# Patient Record
Sex: Male | Born: 1975 | Race: Black or African American | Hispanic: No | Marital: Married | Smoking: Former smoker
Health system: Southern US, Community
[De-identification: ages and names within clinical notes are randomized; demographics above are authoritative.]

## PROBLEM LIST (undated history)

## (undated) ENCOUNTER — Ambulatory Visit: Admission: EM | Source: Home / Self Care

## (undated) DIAGNOSIS — B181 Chronic viral hepatitis B without delta-agent: Secondary | ICD-10-CM

---

## 1999-04-21 ENCOUNTER — Emergency Department (HOSPITAL_COMMUNITY): Admission: EM | Admit: 1999-04-21 | Discharge: 1999-04-21 | Payer: Self-pay | Admitting: Emergency Medicine

## 1999-05-04 ENCOUNTER — Emergency Department (HOSPITAL_COMMUNITY): Admission: EM | Admit: 1999-05-04 | Discharge: 1999-05-04 | Payer: Self-pay | Admitting: Emergency Medicine

## 2000-03-28 ENCOUNTER — Emergency Department (HOSPITAL_COMMUNITY): Admission: EM | Admit: 2000-03-28 | Discharge: 2000-03-28 | Payer: Self-pay | Admitting: Emergency Medicine

## 2000-05-08 ENCOUNTER — Inpatient Hospital Stay (HOSPITAL_COMMUNITY): Admission: EM | Admit: 2000-05-08 | Discharge: 2000-05-10 | Payer: Self-pay | Admitting: Emergency Medicine

## 2000-05-08 ENCOUNTER — Encounter: Payer: Self-pay | Admitting: Emergency Medicine

## 2000-07-10 ENCOUNTER — Emergency Department (HOSPITAL_COMMUNITY): Admission: EM | Admit: 2000-07-10 | Discharge: 2000-07-11 | Payer: Self-pay | Admitting: *Deleted

## 2000-10-29 ENCOUNTER — Emergency Department (HOSPITAL_COMMUNITY): Admission: EM | Admit: 2000-10-29 | Discharge: 2000-10-29 | Payer: Self-pay | Admitting: Emergency Medicine

## 2002-08-25 ENCOUNTER — Emergency Department (HOSPITAL_COMMUNITY): Admission: EM | Admit: 2002-08-25 | Discharge: 2002-08-25 | Payer: Self-pay | Admitting: Emergency Medicine

## 2014-03-01 ENCOUNTER — Encounter (HOSPITAL_COMMUNITY): Payer: Self-pay | Admitting: Emergency Medicine

## 2014-03-01 ENCOUNTER — Emergency Department (HOSPITAL_COMMUNITY)
Admission: EM | Admit: 2014-03-01 | Discharge: 2014-03-01 | Disposition: A | Discharge status: Home / Self Care | Payer: 59 | Source: Home / Self Care | Attending: Family Medicine | Admitting: Family Medicine

## 2014-03-01 DIAGNOSIS — J00 Acute nasopharyngitis [common cold]: Secondary | ICD-10-CM

## 2014-03-01 MED ORDER — BENZONATATE 100 MG PO CAPS: 100.0000 mg | ORAL_CAPSULE | Freq: Three times a day (TID) | ORAL | Status: AC | PRN

## 2014-03-01 MED ORDER — ALBUTEROL SULFATE HFA 108 (90 BASE) MCG/ACT IN AERS
1.0000 | INHALATION_SPRAY | Freq: Four times a day (QID) | RESPIRATORY_TRACT | Status: AC | PRN
Start: 2014-03-01 — End: ?

## 2014-03-01 MED ORDER — IPRATROPIUM BROMIDE 0.06 % NA SOLN: 2.0000 | Freq: Four times a day (QID) | NASAL | Status: AC

## 2014-03-01 NOTE — ED Notes (Addendum)
States he had the flu last weekend.  He went to work [employer], [employer].  C/o fatigue, bodyaches, no appetite, cough non-prod. for the most part, and runny/stuffy nose.  No sore throat or fever.  Did not go to work today.  SOB at times

## 2014-03-01 NOTE — ED Provider Notes (Signed)
CSN: [id number]     Arrival date & time 03/01/14  1853 History   First MD Initiated Contact with Patient 03/01/14 1915     Chief Complaint  Patient presents with  . 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 male presenting with URI. The history is provided by the patient.  URI Presenting symptoms: congestion, cough, rhinorrhea and sore throat   Presenting symptoms: no ear pain, no facial pain, no fatigue and no fever   Severity:  Moderate Onset quality:  Gradual Duration:  5 days Timing:  Constant Progression:  Unchanged Chronicity:  New Associated symptoms: wheezing   Associated symptoms: no arthralgias, no headaches, no myalgias, no neck pain, no sinus pain, no sneezing and no swollen glands   Risk factors: no immunosuppression, no recent illness, no recent travel and no sick contacts     History reviewed. No pertinent past medical history. History reviewed. No pertinent past surgical history. History reviewed. No pertinent family history. History  Substance Use Topics  . Smoking status: Current Every Day Smoker -- 1.00 packs/day    Types: Cigarettes  . Smokeless tobacco: Not on file  . Alcohol Use: No    Review of Systems  Constitutional: Negative for fever, chills and fatigue.  HENT: Positive for congestion, postnasal drip, rhinorrhea, sinus pressure and sore throat. Negative for ear pain and sneezing.   Eyes: Negative.   Respiratory: Positive for cough and wheezing. Negative for chest tightness and shortness of breath.   Cardiovascular: Negative.   Gastrointestinal: Negative.   Musculoskeletal: Negative for arthralgias, myalgias and neck pain.  Skin: Negative.   Neurological: Negative for headaches.    Allergies  Review of patient's allergies indicates no known allergies.  Home Medications   Prior to Admission  medications   Medication Sig Start Date End Date Taking? Authorizing Provider  Pseudoeph-Doxylamine-DM-APAP (NYQUIL PO) Take by mouth.   Yes Historical Provider, MD  albuterol (PROVENTIL HFA;VENTOLIN HFA) 108 (90 BASE) MCG/ACT inhaler Inhale 1-2 puffs into the lungs every 6 (six) hours as needed for wheezing or shortness of breath. 03/01/14   Mathis FareJennifer Lee H Consepcion Utt, PA  benzonatate (TESSALON) 100 MG capsule Take 1 capsule (100 mg total) by mouth 3 (three) times daily as needed for cough. 03/01/14   Mathis FareJennifer Lee H Fahd Galea, PA  ipratropium (ATROVENT) 0.06 % nasal spray Place 2 sprays into both nostrils 4 (four) times daily. For nasal congestion 03/01/14   Mathis FareJennifer Lee H Raysa Bosak, PA   BP 111/73  Pulse 67  Temp(Src) 98.9 F (37.2 C) (Oral)  Resp 16  SpO2 100% Physical Exam  Nursing note and vitals reviewed. Constitutional: He is oriented to person, place, and time. He appears well-developed and well-nourished. No distress.  HENT:  Head: Normocephalic and atraumatic.  Right Ear: Hearing, tympanic membrane, external ear and ear canal normal.  Left Ear: Hearing, tympanic membrane, external ear and ear canal normal.  Nose: Mucosal edema and rhinorrhea present.  Mouth/Throat: Uvula is midline, oropharynx is clear and moist and mucous membranes are normal.  Eyes: Conjunctivae are normal. No scleral icterus.  Neck: Normal range of motion. Neck supple.  Cardiovascular: Normal rate, regular rhythm and normal heart sounds.   Pulmonary/Chest: Effort normal and breath sounds normal. No respiratory distress. He has no wheezes.  Musculoskeletal: Normal range of motion.  Lymphadenopathy:    He has no cervical adenopathy.  Neurological: He is alert and oriented to person,  place, and time.  Skin: Skin is warm and dry. No rash noted. No erythema.  Psychiatric: He has a normal mood and affect. His behavior is normal.    ED Course  Procedures (including critical care time) Labs Review Labs Reviewed - No  data to display  Imaging Review No results found.   MDM   1. Common cold    Common cold Symptomatic care at home Tylenol, fluids and rest Tessalon for cough Atrovent for nasal congestion Albuterol for occasional wheezing Advised to discontinue smoking Expect improvement over next 5-6 days   Ria ClockJennifer Lee H Cyani Kallstrom, PA 03/01/14 1941

## 2014-03-01 NOTE — Discharge Instructions (Signed)
Upper Respiratory Infection, Adult An upper respiratory infection (URI) is also sometimes known as the common cold. The upper respiratory tract includes the nose, sinuses, throat, trachea, and bronchi. Bronchi are the airways leading to the lungs. Most people improve within 1 week, but symptoms can last up to 2 weeks. A residual cough may last even longer.  CAUSES Many different viruses can infect the tissues lining the upper respiratory tract. The tissues become irritated and inflamed and often become very moist. Mucus production is also common. A cold is contagious. You can easily spread the virus to others by oral contact. This includes kissing, sharing a glass, coughing, or sneezing. Touching your mouth or nose and then touching a surface, which is then touched by another person, can also spread the virus. SYMPTOMS  Symptoms typically develop 1 to 3 days after you come in contact with a cold virus. Symptoms vary from person to person. They may include:  Runny nose.  Sneezing.  Nasal congestion.  Sinus irritation.  Sore throat.  Loss of voice (laryngitis).  Cough.  Fatigue.  Muscle aches.  Loss of appetite.  Headache.  Low-grade fever. DIAGNOSIS  You might diagnose your own cold based on familiar symptoms, since most people get a cold 2 to 3 times a year. Your caregiver can confirm this based on your exam. Most importantly, your caregiver can check that your symptoms are not due to another disease such as strep throat, sinusitis, pneumonia, asthma, or epiglottitis. Blood tests, throat tests, and X-rays are not necessary to diagnose a common cold, but they may sometimes be helpful in excluding other more serious diseases. Your caregiver will decide if any further tests are required. RISKS AND COMPLICATIONS  You may be at risk for a more severe case of the common cold if you smoke cigarettes, have chronic heart disease (such as heart failure) or lung disease (such as asthma), or if  you have a weakened immune system. The very Ronning and very old are also at risk for more serious infections. Bacterial sinusitis, middle ear infections, and bacterial pneumonia can complicate the common cold. The common cold can worsen asthma and chronic obstructive pulmonary disease (COPD). Sometimes, these complications can require emergency medical care and may be life-threatening. PREVENTION  The best way to protect against getting a cold is to practice good hygiene. Avoid oral or hand contact with people with cold symptoms. Wash your hands often if contact occurs. There is no clear evidence that vitamin C, vitamin E, echinacea, or exercise reduces the chance of developing a cold. However, it is always recommended to get plenty of rest and practice good nutrition. TREATMENT  Treatment is directed at relieving symptoms. There is no cure. Antibiotics are not effective, because the infection is caused by a virus, not by bacteria. Treatment may include:  Increased fluid intake. Sports drinks offer valuable electrolytes, sugars, and fluids.  Breathing heated mist or steam (vaporizer or shower).  Eating chicken soup or other clear broths, and maintaining good nutrition.  Getting plenty of rest.  Using gargles or lozenges for comfort.  Controlling fevers with ibuprofen or acetaminophen as directed by your caregiver.  Increasing usage of your inhaler if you have asthma. Zinc gel and zinc lozenges, taken in the first 24 hours of the common cold, can shorten the duration and lessen the severity of symptoms. Pain medicines may help with fever, muscle aches, and throat pain. A variety of non-prescription medicines are available to treat congestion and runny nose. Your caregiver   can make recommendations and may suggest nasal or lung inhalers for other symptoms.  HOME CARE INSTRUCTIONS   Only take over-the-counter or prescription medicines for pain, discomfort, or fever as directed by your  caregiver.  Use a warm mist humidifier or inhale steam from a shower to increase air moisture. This may keep secretions moist and make it easier to breathe.  Drink enough water and fluids to keep your urine clear or pale yellow.  Rest as needed.  Return to work when your temperature has returned to normal or as your caregiver advises. You may need to stay home longer to avoid infecting others. You can also use a face mask and careful hand washing to prevent spread of the virus. SEEK MEDICAL CARE IF:   After the first few days, you feel you are getting worse rather than better.  You need your caregiver's advice about medicines to control symptoms.  You develop chills, worsening shortness of breath, or brown or red sputum. These may be signs of pneumonia.  You develop yellow or brown nasal discharge or pain in the face, especially when you bend forward. These may be signs of sinusitis.  You develop a fever, swollen neck glands, pain with swallowing, or white areas in the back of your throat. These may be signs of strep throat. SEEK IMMEDIATE MEDICAL CARE IF:   You have a fever.  You develop severe or persistent headache, ear pain, sinus pain, or chest pain.  You develop wheezing, a prolonged cough, cough up blood, or have a change in your usual mucus (if you have chronic lung disease).  You develop sore muscles or a stiff neck. Document Released: 10/22/2000 Document Revised: 07/21/2011 Document Reviewed: 08/03/2013 ExitCare Patient Information 2015 [employer]. This information is not intended to replace advice given to you by your health care provider. Make sure you discuss any questions you have with your health care provider.  

## 2014-03-03 NOTE — ED Provider Notes (Signed)
Medical screening examination/treatment/procedure(s) were performed by resident physician or non-physician practitioner and as supervising physician I was immediately available for consultation/collaboration.   KINDL,JAMES DOUGLAS MD.   James D Kindl, MD 03/03/14 1007 

## 2014-06-14 ENCOUNTER — Encounter (HOSPITAL_COMMUNITY): Payer: Self-pay | Admitting: Emergency Medicine

## 2014-06-14 ENCOUNTER — Emergency Department (INDEPENDENT_AMBULATORY_CARE_PROVIDER_SITE_OTHER)
Admission: EM | Admit: 2014-06-14 | Discharge: 2014-06-14 | Disposition: A | Discharge status: Home / Self Care | Payer: Self-pay | Source: Home / Self Care

## 2014-06-14 DIAGNOSIS — J069 Acute upper respiratory infection, unspecified: Secondary | ICD-10-CM

## 2014-06-14 NOTE — Discharge Instructions (Signed)
Upper Respiratory Infection, Adult An upper respiratory infection (URI) is also sometimes known as the common cold. The upper respiratory tract includes the nose, sinuses, throat, trachea, and bronchi. Bronchi are the airways leading to the lungs. Most people improve within 1 week, but symptoms can last up to 2 weeks. A residual cough may last even longer.  CAUSES Many different viruses can infect the tissues lining the upper respiratory tract. The tissues become irritated and inflamed and often become very moist. Mucus production is also common. A cold is contagious. You can easily spread the virus to others by oral contact. This includes kissing, sharing a glass, coughing, or sneezing. Touching your mouth or nose and then touching a surface, which is then touched by another person, can also spread the virus. SYMPTOMS  Symptoms typically develop 1 to 3 days after you come in contact with a cold virus. Symptoms vary from person to person. They may include:  Runny nose.  Sneezing.  Nasal congestion.  Sinus irritation.  Sore throat.  Loss of voice (laryngitis).  Cough.  Fatigue.  Muscle aches.  Loss of appetite.  Headache.  Low-grade fever. DIAGNOSIS  You might diagnose your own cold based on familiar symptoms, since most people get a cold 2 to 3 times a year. Your caregiver can confirm this based on your exam. Most importantly, your caregiver can check that your symptoms are not due to another disease such as strep throat, sinusitis, pneumonia, asthma, or epiglottitis. Blood tests, throat tests, and X-rays are not necessary to diagnose a common cold, but they may sometimes be helpful in excluding other more serious diseases. Your caregiver will decide if any further tests are required. RISKS AND COMPLICATIONS  You may be at risk for a more severe case of the common cold if you smoke cigarettes, have chronic heart disease (such as heart failure) or lung disease (such as asthma), or if  you have a weakened immune system. The very Perezgarcia and very old are also at risk for more serious infections. Bacterial sinusitis, middle ear infections, and bacterial pneumonia can complicate the common cold. The common cold can worsen asthma and chronic obstructive pulmonary disease (COPD). Sometimes, these complications can require emergency medical care and may be life-threatening. PREVENTION  The best way to protect against getting a cold is to practice good hygiene. Avoid oral or hand contact with people with cold symptoms. Wash your hands often if contact occurs. There is no clear evidence that vitamin C, vitamin E, echinacea, or exercise reduces the chance of developing a cold. However, it is always recommended to get plenty of rest and practice good nutrition. TREATMENT  Treatment is directed at relieving symptoms. There is no cure. Antibiotics are not effective, because the infection is caused by a virus, not by bacteria. Treatment may include:  Increased fluid intake. Sports drinks offer valuable electrolytes, sugars, and fluids.  Breathing heated mist or steam (vaporizer or shower).  Eating chicken soup or other clear broths, and maintaining good nutrition.  Getting plenty of rest.  Using gargles or lozenges for comfort.  Controlling fevers with ibuprofen or acetaminophen as directed by your caregiver.  Increasing usage of your inhaler if you have asthma. Zinc gel and zinc lozenges, taken in the first 24 hours of the common cold, can shorten the duration and lessen the severity of symptoms. Pain medicines may help with fever, muscle aches, and throat pain. A variety of non-prescription medicines are available to treat congestion and runny nose. Your caregiver   can make recommendations and may suggest nasal or lung inhalers for other symptoms.  HOME CARE INSTRUCTIONS   Only take over-the-counter or prescription medicines for pain, discomfort, or fever as directed by your  caregiver.  Use a warm mist humidifier or inhale steam from a shower to increase air moisture. This may keep secretions moist and make it easier to breathe.  Drink enough water and fluids to keep your urine clear or pale yellow.  Rest as needed.  Return to work when your temperature has returned to normal or as your caregiver advises. You may need to stay home longer to avoid infecting others. You can also use a face mask and careful hand washing to prevent spread of the virus. SEEK MEDICAL CARE IF:   After the first few days, you feel you are getting worse rather than better.  You need your caregiver's advice about medicines to control symptoms.  You develop chills, worsening shortness of breath, or brown or red sputum. These may be signs of pneumonia.  You develop yellow or brown nasal discharge or pain in the face, especially when you bend forward. These may be signs of sinusitis.  You develop a fever, swollen neck glands, pain with swallowing, or white areas in the back of your throat. These may be signs of strep throat. SEEK IMMEDIATE MEDICAL CARE IF:   You have a fever.  You develop severe or persistent headache, ear pain, sinus pain, or chest pain.  You develop wheezing, a prolonged cough, cough up blood, or have a change in your usual mucus (if you have chronic lung disease).  You develop sore muscles or a stiff neck. Document Released: 10/22/2000 Document Revised: 07/21/2011 Document Reviewed: 08/03/2013 ExitCare Patient Information 2015 [employer]. This information is not intended to replace advice given to you by your health care provider. Make sure you discuss any questions you have with your health care provider.  

## 2014-06-14 NOTE — ED Notes (Signed)
C/o cold sx onset Monday Sx include: BA, fevers, nauseas, congestion, runny nose, wheezing Smokes 1 PPD Taking OTC cold meds w/no relief Alert, no signs of acute distress.

## 2014-06-14 NOTE — ED Provider Notes (Signed)
CSN: [id number]     Arrival date & time 06/14/14  1928 History   None    Chief Complaint  Patient presents with  . 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   Patient is a 39 y.o. male presenting with URI. The history is provided by the patient.  URI Presenting symptoms: congestion, cough, fatigue, fever and rhinorrhea   Presenting symptoms: no ear pain, no facial pain and no sore throat   Severity:  Moderate Onset quality:  Gradual Duration:  2 days Timing:  Constant Progression:  Improving Chronicity:  New Associated symptoms: myalgias   Associated symptoms: no wheezing     History reviewed. No pertinent past medical history. History reviewed. No pertinent past surgical history. No family history on file. History  Substance Use Topics  . Smoking status: Current Every Day Smoker -- 1.00 packs/day    Types: Cigarettes  . Smokeless tobacco: Not on file  . Alcohol Use: No    Review of Systems  Constitutional: Positive for fever and fatigue.  HENT: Positive for congestion and rhinorrhea. Negative for ear pain and sore throat.   Eyes:       States he thinks he had pink eye earlier in the eek, but these symptoms have nearly resolved.   Respiratory: Positive for cough. Negative for chest tightness, shortness of breath and wheezing.   Cardiovascular: Negative.   Gastrointestinal: Negative.   Musculoskeletal: Positive for myalgias.    Allergies  Review of patient's allergies indicates no known allergies.  Home Medications   Prior to Admission medications   Medication Sig Start Date End Date Taking? Authorizing Provider  albuterol (PROVENTIL HFA;VENTOLIN HFA) 108 (90 BASE) MCG/ACT inhaler Inhale 1-2 puffs into the lungs every 6 (six) hours as needed for wheezing or shortness of breath. 03/01/14    Mathis FareJennifer Lee H Maybelle Depaoli, PA  benzonatate (TESSALON) 100 MG capsule Take 1 capsule (100 mg total) by mouth 3 (three) times daily as needed for cough. 03/01/14   Mathis FareJennifer Lee H Madine Sarr, PA  ipratropium (ATROVENT) 0.06 % nasal spray Place 2 sprays into both nostrils 4 (four) times daily. For nasal congestion 03/01/14   Mathis FareJennifer Lee H Zackry Deines, PA  Pseudoeph-Doxylamine-DM-APAP (NYQUIL PO) Take by mouth.    Historical Provider, MD   BP 117/76 mmHg  Pulse 98  Temp(Src) 98.2 F (36.8 C) (Oral)  Resp 20  SpO2 98% Physical Exam  Constitutional: He is oriented to person, place, and time. He appears well-developed and well-nourished. No distress.  HENT:  Head: Normocephalic and atraumatic.  Right Ear: Hearing, tympanic membrane, external ear and ear canal normal.  Left Ear: Hearing, tympanic membrane, external ear and ear canal normal.  Nose: Nose normal.  Mouth/Throat: Uvula is midline, oropharynx is clear and moist and mucous membranes are normal.  Eyes: Conjunctivae are normal. Right eye exhibits no discharge. Left eye exhibits no discharge. No scleral icterus.  Neck: Normal range of motion. Neck supple.  Cardiovascular: Normal rate, regular rhythm and normal heart sounds.   Pulmonary/Chest: Effort normal and breath sounds normal.  Musculoskeletal: Normal range of motion.  Lymphadenopathy:    He has no cervical adenopathy.  Neurological: He is alert and oriented to person, place, and time.  Skin: Skin is warm and dry. No rash noted. No erythema.  Psychiatric: He has a normal mood and affect. His behavior is normal.  Nursing note  and vitals reviewed.   ED Course  Procedures (including critical care time) Labs Review Labs Reviewed - No data to display  Imaging Review No results found.   MDM   1. URI (upper respiratory infection)    Continued symptomatic care at home. Follow up with PCP if symptoms worsen    Ria Clock, [place] 06/14/14 [phone]

## 2014-06-28 ENCOUNTER — Other Ambulatory Visit: Payer: Self-pay | Admitting: Occupational Medicine

## 2014-06-28 ENCOUNTER — Ambulatory Visit
Admission: RE | Admit: 2014-06-28 | Discharge: 2014-06-28 | Disposition: A | Discharge status: Home / Self Care | Payer: Worker's Compensation | Source: Ambulatory Visit | Attending: Occupational Medicine | Admitting: Occupational Medicine

## 2014-06-28 DIAGNOSIS — R609 Edema, unspecified: Secondary | ICD-10-CM

## 2014-06-28 DIAGNOSIS — M2550 Pain in unspecified joint: Secondary | ICD-10-CM

## 2017-09-09 ENCOUNTER — Encounter (HOSPITAL_COMMUNITY): Payer: Self-pay | Admitting: Emergency Medicine

## 2017-09-09 ENCOUNTER — Other Ambulatory Visit: Payer: Self-pay

## 2017-09-09 DIAGNOSIS — Z87891 Personal history of nicotine dependence: Secondary | ICD-10-CM | POA: Insufficient documentation

## 2017-09-09 DIAGNOSIS — N390 Urinary tract infection, site not specified: Secondary | ICD-10-CM | POA: Insufficient documentation

## 2017-09-09 NOTE — ED Notes (Signed)
Pt instructed on need for urine sample. Pt unable to produce at this time.

## 2017-09-09 NOTE — ED Triage Notes (Signed)
Pt reports having burning, itching, and discharge from penis for the last several weeks. Pt reports being tested for STD but tests were negative. Pt states he was not given any prophylactic Treatment.

## 2017-09-10 ENCOUNTER — Emergency Department (HOSPITAL_COMMUNITY)
Admission: EM | Admit: 2017-09-10 | Discharge: 2017-09-10 | Disposition: A | Discharge status: Home / Self Care | Payer: Self-pay | Attending: Emergency Medicine | Admitting: Emergency Medicine

## 2017-09-10 DIAGNOSIS — R8281 Pyuria: Secondary | ICD-10-CM

## 2017-09-10 LAB — URINALYSIS, ROUTINE W REFLEX MICROSCOPIC
BACTERIA UA: NONE SEEN
BILIRUBIN URINE: NEGATIVE
Glucose, UA: NEGATIVE mg/dL
Hgb urine dipstick: NEGATIVE
KETONES UR: NEGATIVE mg/dL
NITRITE: NEGATIVE
Protein, ur: NEGATIVE mg/dL
Specific Gravity, Urine: 1.009 (ref 1.005–1.030)
pH: 5 (ref 5.0–8.0)

## 2017-09-10 LAB — GC/CHLAMYDIA PROBE AMP (~~LOC~~) NOT AT ARMC
CHLAMYDIA, DNA PROBE: NEGATIVE
NEISSERIA GONORRHEA: NEGATIVE

## 2017-09-10 MED ORDER — AZITHROMYCIN 250 MG PO TABS
1000.0000 mg | ORAL_TABLET | Freq: Once | ORAL | Status: AC
  Administered 2017-09-10: 1000 mg via ORAL
  Filled 2017-09-10: qty 4

## 2017-09-10 MED ORDER — CEFTRIAXONE SODIUM 250 MG IJ SOLR
250.0000 mg | Freq: Once | INTRAMUSCULAR | Status: AC
  Administered 2017-09-10: 250 mg via INTRAMUSCULAR
  Filled 2017-09-10: qty 250

## 2017-09-10 MED ORDER — LIDOCAINE HCL 1 % IJ SOLN
INTRAMUSCULAR | Status: AC
  Administered 2017-09-10: 0.9 mL
  Filled 2017-09-10: qty 20

## 2017-09-10 MED ORDER — CEPHALEXIN 500 MG PO CAPS: 500.0000 mg | ORAL_CAPSULE | Freq: Two times a day (BID) | ORAL | 0 refills | Status: DC

## 2017-09-10 NOTE — Discharge Instructions (Addendum)
Your urine today was concerning for possible STD exposure. You have been treated for gonorrhea and chlamydia today. Follow-up with the health department in 48 hours for the results of your STD tests. If you test positive for STDs, notify all sexual partners of their need to be tested and treated as well. Do not engage in sexual intercourse for one week. Use a condom when sexually active.  It is also possible that the white blood cells in your urine may be representative of a UTI.  For this reason, take Keflex as prescribed until finished.  Follow-up with a primary care doctor regarding your visit today.

## 2017-09-10 NOTE — ED Provider Notes (Signed)
[facility]-EMERGENCY DEPT Provider Note   CSN: [id number] Arrival date & time: 09/09/17  2244     History   Chief Complaint Chief Complaint  Patient presents with  . Dysuria    HPI Ernest Little is a 42 y.o. male.   42 year old male presents to the emergency department for evaluation of burning and itching to his genital area for the past few weeks.  He has had sporadic "milky" discharge from his penis at the end of voiding.  He reports being tested for STDs 2 weeks ago which were negative.  He has engaged in sexual intercourse with 2 partners in the past 6 months.  He reports inconsistent use of condoms.  He has been sexually active since his last STD test.  Denies any associated fevers, nausea, vomiting, abdominal pain, genital lesions, scrotal swelling, testicular tenderness.  He expresses concern about persistent symptoms.     History reviewed. No pertinent past medical history.  There are no active problems to display for this patient.   History reviewed. No pertinent surgical history.      Home Medications    Prior to Admission medications   Medication Sig Start Date End Date Taking? Authorizing Provider  albuterol (PROVENTIL HFA;VENTOLIN HFA) 108 (90 BASE) MCG/ACT inhaler Inhale 1-2 puffs into the lungs every 6 (six) hours as needed for wheezing or shortness of breath. 03/01/14   Presson, Mathis Fare, PA  benzonatate (TESSALON) 100 MG capsule Take 1 capsule (100 mg total) by mouth 3 (three) times daily as needed for cough. 03/01/14   Presson, Mathis Fare, PA  cephALEXin (KEFLEX) 500 MG capsule Take 1 capsule (500 mg total) by mouth 2 (two) times daily. 09/10/17   Antony Madura, PA-C  ipratropium (ATROVENT) 0.06 % nasal spray Place 2 sprays into both nostrils 4 (four) times daily. For nasal congestion 03/01/14   Presson, Jess Barters H, PA  Pseudoeph-Doxylamine-DM-APAP (NYQUIL PO) Take by mouth.    [provider]    Family  History History reviewed. No pertinent family history.  Social History Social History   Tobacco Use  . Smoking status: Former Smoker    Packs/day: 1.00    Types: Cigarettes  . Smokeless tobacco: Never Used  Substance Use Topics  . Alcohol use: No  . Drug use: Never     Allergies   Patient has no known allergies.   Review of Systems Review of Systems Ten systems reviewed and are negative for acute change, except as noted in the HPI.    Physical Exam Updated Vital Signs BP 126/90 (BP Location: Right Arm)   Pulse (!) 58   Temp 98.1 F (36.7 C) (Oral)   Resp 17   Ht  (1.702 m)   Wt 80.3 kg (177 lb)   SpO2 99%   BMI 27.72 kg/m   Physical Exam  Constitutional: He is oriented to person, place, and time. He appears well-developed and well-nourished. No distress.  Nontoxic appearing, pleasant  HENT:  Head: Normocephalic and atraumatic.  Eyes: Conjunctivae and EOM are normal. No scleral icterus.  Neck: Normal range of motion.  Pulmonary/Chest: Effort normal. No stridor. No respiratory distress.  Genitourinary: Right testis shows no mass, no swelling and no tenderness. Right testis is descended. Left testis shows no mass, no swelling and no tenderness. Left testis is descended. Circumcised. No discharge found.  Genitourinary Comments: Exam chaperoned by RN, Tonye Pearson  Musculoskeletal: Normal range of motion.  Neurological: He is alert and oriented to  person, place, and time. He exhibits normal muscle tone. Coordination normal.  Skin: Skin is warm and dry. No rash noted. He is not diaphoretic. No erythema. No pallor.  Psychiatric: He has a normal mood and affect. His behavior is normal.  Nursing note and vitals reviewed.    ED Treatments / Results  Labs (all labs ordered are listed, but only abnormal results are displayed) Labs Reviewed  URINALYSIS, ROUTINE W REFLEX MICROSCOPIC - Abnormal; Notable for the following components:      Result Value   Color,  Urine STRAW (*)    Leukocytes, UA MODERATE (*)    All other components within normal limits  GC/CHLAMYDIA PROBE AMP ([facility]) NOT AT [facility]    EKG None  Radiology No results found.  Procedures Procedures (including critical care time)  Medications Ordered in ED Medications  cefTRIAXone (ROCEPHIN) injection 250 mg (has no administration in time range)  azithromycin (ZITHROMAX) tablet 1,000 mg (has no administration in time range)     Initial Impression / Assessment and Plan / ED Course  I have reviewed the triage vital signs and the nursing notes.  Pertinent labs & imaging results that were available during my care of the patient were reviewed by me and considered in my medical decision making (see chart for details).     Patient to be discharged with instructions to follow up with the Health Department. Discussed importance of using protection when sexually active. Pt understands that they have GC/Chlamydia cultures pending and that they will need to inform all sexual partners if results return positive. Pt has been treated prophylacticly with azithromycin and rocephin due to pts history and presence of pyuria. Unable to exclude uncomplicated UTI; will discharge on Keflex.  Return precautions discussed and provided. Patient discharged in stable condition with no unaddressed concerns.   Final Clinical Impressions(s) / ED Diagnoses   Final diagnoses:  Pyuria    ED Discharge Orders        Ordered    cephALEXin (KEFLEX) 500 MG capsule  2 times daily     09/10/17 0140       Antony Madura, PA-C 09/10/17 0144    Mancel Bale, MD 09/10/17 [phone]

## 2019-02-25 ENCOUNTER — Other Ambulatory Visit: Payer: Self-pay

## 2019-02-25 DIAGNOSIS — Z20822 Contact with and (suspected) exposure to covid-19: Secondary | ICD-10-CM

## 2019-02-27 LAB — NOVEL CORONAVIRUS, NAA: SARS-CoV-2, NAA: NOT DETECTED

## 2020-11-24 ENCOUNTER — Encounter (HOSPITAL_COMMUNITY): Payer: Self-pay | Admitting: Emergency Medicine

## 2020-11-24 ENCOUNTER — Emergency Department (HOSPITAL_COMMUNITY): Payer: Self-pay

## 2020-11-24 ENCOUNTER — Other Ambulatory Visit: Payer: Self-pay

## 2020-11-24 ENCOUNTER — Emergency Department (HOSPITAL_COMMUNITY)
Admission: EM | Admit: 2020-11-24 | Discharge: 2020-11-24 | Disposition: A | Discharge status: Home / Self Care | Payer: Self-pay | Attending: Emergency Medicine | Admitting: Emergency Medicine

## 2020-11-24 DIAGNOSIS — Z87891 Personal history of nicotine dependence: Secondary | ICD-10-CM | POA: Insufficient documentation

## 2020-11-24 DIAGNOSIS — R079 Chest pain, unspecified: Secondary | ICD-10-CM | POA: Insufficient documentation

## 2020-11-24 LAB — CBC
HCT: 38.6 % — ABNORMAL LOW (ref 39.0–52.0)
Hemoglobin: 12.8 g/dL — ABNORMAL LOW (ref 13.0–17.0)
MCH: 30 pg (ref 26.0–34.0)
MCHC: 33.2 g/dL (ref 30.0–36.0)
MCV: 90.6 fL (ref 80.0–100.0)
Platelets: 321 10*3/uL (ref 150–400)
RBC: 4.26 MIL/uL (ref 4.22–5.81)
RDW: 13 % (ref 11.5–15.5)
WBC: 6.8 10*3/uL (ref 4.0–10.5)
nRBC: 0 % (ref 0.0–0.2)

## 2020-11-24 LAB — TROPONIN I (HIGH SENSITIVITY): Troponin I (High Sensitivity): 3 ng/L (ref <18)

## 2020-11-24 LAB — BASIC METABOLIC PANEL
Anion gap: 7 (ref 5–15)
BUN: 7 mg/dL (ref 6–20)
CO2: 27 mmol/L (ref 22–32)
Calcium: 9 mg/dL (ref 8.9–10.3)
Chloride: 106 mmol/L (ref 98–111)
Creatinine, Ser: 1.31 mg/dL — ABNORMAL HIGH (ref 0.61–1.24)
GFR, Estimated: >60 mL/min (ref >60)
Glucose, Bld: 93 mg/dL (ref 70–99)
Potassium: 3.6 mmol/L (ref 3.5–5.1)
Sodium: 140 mmol/L (ref 135–145)

## 2020-11-24 NOTE — ED Provider Notes (Signed)
[facility] EMERGENCY DEPARTMENT Provider Note   CSN: [id number] Arrival date & time: 11/24/20  1534     History Chief Complaint  Patient presents with   Chest Pain    Ernest Little is a 45 y.o. male.  45 yo M with a cc of L sided chest pain. Going on for a few days now.  Comes and goes, last for a couple seconds at a time.  No fevers, chills, cough.  No trauma.  Denies history of hypertension hyperlipidemia diabetes is a current every day smoker.  Father had MI but is not sure how old he was.  Denies history of PE or DVT denies hemoptysis denies unilateral lower extremity edema denies recent surgery immobilization or hospitalization denies testosterone use.  Denies history of cancer.  The history is provided by the patient.  Chest Pain Pain location:  L lateral chest Pain quality: sharp   Pain radiates to:  Does not radiate Pain severity:  Severe Onset quality:  Sudden Duration:  2 hours Timing:  Constant Progression:  Worsening Chronicity:  New Relieved by:  Nothing Worsened by:  Nothing Ineffective treatments:  None tried Associated symptoms: no abdominal pain, no fever, no headache, no palpitations, no shortness of breath and no vomiting       History reviewed. No pertinent past medical history.  There are no problems to display for this patient.   History reviewed. No pertinent surgical history.     No family history on file.  Social History   Tobacco Use   Smoking status: Former    Packs/day: 1.00    Types: Cigarettes   Smokeless tobacco: Never  Vaping Use   Vaping Use: Every day  Substance Use Topics   Alcohol use: No   Drug use: Never    Home Medications Prior to Admission medications   Medication Sig Start Date End Date Taking? Authorizing Provider  albuterol (PROVENTIL HFA;VENTOLIN HFA) 108 (90 BASE) MCG/ACT inhaler Inhale 1-2 puffs into the lungs every 6 (six) hours as needed for wheezing or shortness of breath. Patient  not taking: No sig reported 03/01/14   Presson, Mathis Fare, PA  benzonatate (TESSALON) 100 MG capsule Take 1 capsule (100 mg total) by mouth 3 (three) times daily as needed for cough. Patient not taking: No sig reported 03/01/14   Presson, Jess Barters H, PA  cephALEXin (KEFLEX) 500 MG capsule Take 1 capsule (500 mg total) by mouth 2 (two) times daily. Patient not taking: No sig reported 09/10/17   Antony Madura, PA-C  ipratropium (ATROVENT) 0.06 % nasal spray Place 2 sprays into both nostrils 4 (four) times daily. For nasal congestion 03/01/14   Presson, Jess Barters H, PA    Allergies    Patient has no known allergies.  Review of Systems   Review of Systems  Constitutional:  Negative for chills and fever.  HENT:  Negative for congestion and facial swelling.   Eyes:  Negative for discharge and visual disturbance.  Respiratory:  Negative for shortness of breath.   Cardiovascular:  Positive for chest pain. Negative for palpitations.  Gastrointestinal:  Negative for abdominal pain, diarrhea and vomiting.  Musculoskeletal:  Negative for arthralgias and myalgias.  Skin:  Negative for color change and rash.  Neurological:  Negative for tremors, syncope and headaches.  Psychiatric/Behavioral:  Negative for confusion and dysphoric mood.    Physical Exam Updated Vital Signs BP 121/79   Pulse 60   Temp 98.4 F (36.9 C) (Oral)  Resp (!) 21   Ht 5\' 7"  (1.702 m)   Wt 83.9 kg   SpO2 99%   BMI 28.98 kg/m   Physical Exam Vitals and nursing note reviewed.  Constitutional:      Appearance: He is well-developed.  HENT:     Head: Normocephalic and atraumatic.  Eyes:     Pupils: Pupils are equal, round, and reactive to light.  Neck:     Vascular: No JVD.  Cardiovascular:     Rate and Rhythm: Normal rate and regular rhythm.     Heart sounds: No murmur heard.   No friction rub. No gallop.  Pulmonary:     Effort: No respiratory distress.     Breath sounds: No wheezing.  Abdominal:      General: There is no distension.     Tenderness: There is no abdominal tenderness. There is no guarding or rebound.  Musculoskeletal:        General: Normal range of motion.     Cervical back: Normal range of motion and neck supple.  Skin:    Coloration: Skin is not pale.     Findings: No rash.  Neurological:     Mental Status: He is alert and oriented to person, place, and time.  Psychiatric:        Behavior: Behavior normal.    ED Results / Procedures / Treatments   Labs (all labs ordered are listed, but only abnormal results are displayed) Labs Reviewed  BASIC METABOLIC PANEL - Abnormal; Notable for the following components:      Result Value   Creatinine, Ser 1.31 (*)    All other components within normal limits  CBC - Abnormal; Notable for the following components:   Hemoglobin 12.8 (*)    HCT 38.6 (*)    All other components within normal limits  TROPONIN I (HIGH SENSITIVITY)    EKG EKG Interpretation  Date/Time:  Saturday November 24 2020 15:42:56 EDT Ventricular Rate:  61 PR Interval:  158 QRS Duration: 84 QT Interval:  390 QTC Calculation: 392 R Axis:   75 Text Interpretation: Normal sinus rhythm Normal ECG No significant change since last tracing Confirmed by [id number] [phone]) on 11/24/2020 4:53:03 PM  Radiology DG Chest 2 View  Result Date: 11/24/2020 CLINICAL DATA:  Chest pain for several days, worse today EXAM: CHEST - 2 VIEW COMPARISON:  None. FINDINGS: The heart size and mediastinal contours are within normal limits. Both lungs are clear. No pleural effusion or pneumothorax. The visualized skeletal structures are unremarkable. IMPRESSION: No active cardiopulmonary disease. Electronically Signed   By: 11/26/2020 M.D.   On: 11/24/2020 16:00    Procedures Procedures  Discussed smoking cessation with patient and was they were offerred resources to help stop.  Total time was 5 min CPT code 11/26/2020.   Medications Ordered in ED Medications - No data to  display  ED Course  I have reviewed the triage vital signs and the nursing notes.  Pertinent labs & imaging results that were available during my care of the patient were reviewed by me and considered in my medical decision making (see chart for details).    MDM Rules/Calculators/A&P                          45 yo M with a cc of chest pain.  This is been going on for about a week or so seems to come and go.  In the left  lateral feels sharp last for a second or 2 and then resolves.  Not having pain currently.  No significant change in greater than 6 hours.  Obtain single troponin blood work chest x-ray EKG reassess.  Troponins negative blood work otherwise unremarkable chest x-ray viewed by me without focal of treated pneumothorax.  We will discharge the patient home.  PCP follow-up.  4:54 PM:  I have discussed the diagnosis/risks/treatment options with the patient and believe the pt to be eligible for discharge home to follow-up with PCP. We also discussed returning to the ED immediately if new or worsening sx occur. We discussed the sx which are most concerning (e.g., sudden worsening pain, fever, inability to tolerate by mouth) that necessitate immediate return. Medications administered to the patient during their visit and any new prescriptions provided to the patient are listed below.  Medications given during this visit Medications - No data to display   The patient appears reasonably screen and/or stabilized for discharge and I doubt any other medical condition or other [facility] requiring further screening, evaluation, or treatment in the ED at this time prior to discharge.   Final Clinical Impression(s) / ED Diagnoses Final diagnoses:  Nonspecific chest pain    Rx / DC Orders ED Discharge Orders     None        Melene Plan, DO 11/24/20 1654

## 2020-11-24 NOTE — ED Triage Notes (Signed)
Patient from home complaint of chest pain for several days worse today.

## 2020-11-24 NOTE — ED Notes (Signed)
Pt d/c home per MD order. Discharge summary reviewed with pt, pt verbalizes understanding. No s/s of acute distress noted at discharge. Ambulatory of unit.

## 2020-11-24 NOTE — Discharge Instructions (Addendum)
Your blood work was unremarkable your chest x-ray did not show pneumonia or collapsed lung.  It is very unlikely that you are having a heart attack.  Please follow-up with your family doctor.  You should stop smoking if he can.  Try pepcid or tagamet up to twice a day.  Try to avoid things that may make this worse, most commonly these are spicy foods tomato based products fatty foods chocolate and peppermint.  Alcohol and tobacco can also make this worse.  Return to the emergency department for sudden worsening pain fever or inability to eat or drink.

## 2021-01-07 ENCOUNTER — Emergency Department (HOSPITAL_COMMUNITY)
Admission: EM | Admit: 2021-01-07 | Discharge: 2021-01-08 | Discharge status: Against Medical Advice | Payer: Medicaid Other

## 2021-01-07 ENCOUNTER — Other Ambulatory Visit: Payer: Self-pay

## 2022-01-04 ENCOUNTER — Emergency Department (HOSPITAL_COMMUNITY)
Admission: EM | Admit: 2022-01-04 | Discharge: 2022-01-05 | Discharge status: Against Medical Advice | Payer: Medicaid Other

## 2022-01-05 ENCOUNTER — Other Ambulatory Visit: Payer: Self-pay

## 2022-01-05 ENCOUNTER — Encounter (HOSPITAL_COMMUNITY): Payer: Self-pay

## 2022-01-05 ENCOUNTER — Emergency Department (HOSPITAL_COMMUNITY)
Admission: EM | Admit: 2022-01-05 | Discharge: 2022-01-05 | Disposition: A | Discharge status: Home / Self Care | Payer: Self-pay | Attending: Emergency Medicine | Admitting: Emergency Medicine

## 2022-01-05 DIAGNOSIS — K0889 Other specified disorders of teeth and supporting structures: Secondary | ICD-10-CM | POA: Insufficient documentation

## 2022-01-05 DIAGNOSIS — K029 Dental caries, unspecified: Secondary | ICD-10-CM | POA: Insufficient documentation

## 2022-01-05 HISTORY — DX: Chronic viral hepatitis B without delta-agent: B18.1

## 2022-01-05 MED ORDER — NAPROXEN 500 MG PO TABS: 500.0000 mg | ORAL_TABLET | Freq: Two times a day (BID) | ORAL | 0 refills | Status: DC

## 2022-01-05 MED ORDER — AMOXICILLIN-POT CLAVULANATE 875-125 MG PO TABS: 1.0000 | ORAL_TABLET | Freq: Two times a day (BID) | ORAL | 0 refills | Status: AC

## 2022-01-05 NOTE — ED Provider Notes (Signed)
[facility]-EMERGENCY DEPT Provider Note   CSN: [id number] Arrival date & time: 01/05/22  0402     History  Chief Complaint  Patient presents with   Dental Pain    Ernest Little is a 46 y.o. male with a history of chronic hepatitis B presenting today due to dental pain.  States the bottom left tooth has been hurting him on and off for the last few months, however last night became significantly painful which caused him to come to the ED.  Says the pain has now diminished but is still a dull ache.  Denies discharge from the area, swelling, mass, or difficulty swallowing.  Denies chest pain, neck stiffness, shortness of breath, vision changes, fevers, or headaches.  Without throat swelling.  No other complaints at this time.  The history is provided by the patient and medical records.  Dental Pain    Home Medications Prior to Admission medications   Medication Sig Start Date End Date Taking? Authorizing Provider  amoxicillin-clavulanate (AUGMENTIN) 875-125 MG tablet Take 1 tablet by mouth every 12 (twelve) hours. 01/05/22  Yes Cecil Cobbs, PA-C  naproxen (NAPROSYN) 500 MG tablet Take 1 tablet (500 mg total) by mouth 2 (two) times daily. 01/05/22  Yes Cecil Cobbs, PA-C  albuterol (PROVENTIL HFA;VENTOLIN HFA) 108 (90 BASE) MCG/ACT inhaler Inhale 1-2 puffs into the lungs every 6 (six) hours as needed for wheezing or shortness of breath. Patient not taking: No sig reported 03/01/14   Presson, Mathis Fare, PA  benzonatate (TESSALON) 100 MG capsule Take 1 capsule (100 mg total) by mouth 3 (three) times daily as needed for cough. Patient not taking: No sig reported 03/01/14   Presson, Jess Barters H, PA  ipratropium (ATROVENT) 0.06 % nasal spray Place 2 sprays into both nostrils 4 (four) times daily. For nasal congestion 03/01/14   Presson, Jess Barters H, PA      Allergies    Patient has no known allergies.    Review of Systems   Review of Systems   HENT:  Positive for dental problem.     Physical Exam Updated Vital Signs BP 124/84 (BP Location: Left Arm)   Pulse (!) 51   Temp 97.9 F (36.6 C) (Oral)   Resp 15   Ht 5\' 7"  (1.702 m)   Wt 84.8 kg   SpO2 98%   BMI 29.29 kg/m  Physical Exam Vitals and nursing note reviewed.  Constitutional:      General: He is not in acute distress.    Appearance: He is well-developed. He is not ill-appearing, toxic-appearing or diaphoretic.  HENT:     Head: Normocephalic and atraumatic.     Mouth/Throat:     Lips: Pink.     Mouth: Mucous membranes are moist. No angioedema.     Dentition: Abnormal dentition. Dental caries present. No dental abscesses.     Palate: No mass.     Pharynx: Oropharynx is clear. No oropharyngeal exudate or posterior oropharyngeal erythema.     Comments: Mild gingival swelling.  Poor dentition overall.  Several dental caries.  Tenderness of the apparent tooth #17 without evidence of surrounding abscess, severe swelling, or drainage.  Tongue without elevation, swelling, or deviation.  Airway patent.  Without lesions, erythema, or swelling of the oropharynx. Eyes:     Conjunctiva/sclera: Conjunctivae normal.  Neck:     Comments: Very supple on exam.  Without significant swelling or tenderness.  No cervical adenopathy appreciated. Cardiovascular:  Rate and Rhythm: Normal rate and regular rhythm.     Pulses: Normal pulses.     Heart sounds: Normal heart sounds. No murmur heard. Pulmonary:     Effort: Pulmonary effort is normal. No respiratory distress.     Breath sounds: Normal breath sounds.  Chest:     Chest wall: No tenderness.  Abdominal:     Palpations: Abdomen is soft.     Tenderness: There is no abdominal tenderness.  Musculoskeletal:        General: No swelling.     Cervical back: Neck supple. No rigidity.  Skin:    General: Skin is warm and dry.     Capillary Refill: Capillary refill takes less than 2 seconds.  Neurological:     Mental Status: He  is alert and oriented to person, place, and time.  Psychiatric:        Mood and Affect: Mood normal.     ED Results / Procedures / Treatments   Labs (all labs ordered are listed, but only abnormal results are displayed) Labs Reviewed - No data to display  EKG None  Radiology No results found.  Procedures Procedures    Medications Ordered in ED Medications - No data to display  ED Course/ Medical Decision Making/ A&P                           Medical Decision Making Risk Prescription drug management.   46 y.o. male presents to the ED for concern of Dental Pain   This involves an extensive number of treatment options, and is a complaint that carries with it a high risk of complications and morbidity.  The emergent differential diagnosis prior to evaluation includes, but is not limited to: Dental caries, dental abscess, dental fracture  This is not an exhaustive differential.   Past Medical History / Co-morbidities / Social History: Hx of chronic hepatitis B Social Determinants of Health include: None  Additional History:  Obtained by chart review.  Notably identifies history of chronic hepatitis B  Lab Tests: None  Imaging Studies: None  ED Course: Pt well-appearing on exam.  Patient presenting with toothache of apparent tooth #17.  Nonseptic, non-ill appearing, in NAD.  Afebrile.  Poor dentition overall.  No visible purulent discharge or gross abscess.  Tongue not swollen or elevated.  Neck without swelling or significant tenderness, is very supple on exam.  Without reduced TMJ ROM.  Mild tenderness of affected area.  Exam unconcerning for Ludwig's angina or spread of infection.  No meningismus, neck is very supple.  Airway intact.  Offered pain management, for which the patient deferred at this time.  Plan to treat with Augmentin and anti-inflammatories.  Urged patient to follow-up closely with dentist.  Pt satisfied with today's encounter.  Patient in NAD and in  good condition at time of discharge.  Disposition: After consideration the patient's encounter today, I do not feel today's workup suggests an emergent condition requiring admission or immediate intervention beyond what has been performed at this time.  Safe for discharge; instructed to return immediately for worsening symptoms, change in symptoms or any other concerns.  I have reviewed the patients home medicines and have made adjustments as needed.  Discussed course of treatment with the patient, whom demonstrated understanding.  Patient in agreement and has no further questions.     This chart was dictated using voice recognition software.  Despite best efforts to proofread, errors can occur  which can change the documentation meaning.         Final Clinical Impression(s) / ED Diagnoses Final diagnoses:  Pain due to dental caries    Rx / DC Orders ED Discharge Orders          Ordered    amoxicillin-clavulanate (AUGMENTIN) 875-125 MG tablet  Every 12 hours        01/05/22 0822    naproxen (NAPROSYN) 500 MG tablet  2 times daily        01/05/22 9323              Cecil Cobbs, PA-C 01/05/22 0912    Sloan Leiter, DO 01/10/22 [phone]

## 2022-01-05 NOTE — ED Triage Notes (Signed)
Ambulatory to ED with c/o tooth pain. States he took "some oral stuff" for it. Has not been to dentist.

## 2022-01-05 NOTE — Discharge Instructions (Addendum)
Continue your follow up with Dr. Victoriano Lain as planned within the next 1-2 days for re-evaluation and continued medical management.    An antibiotic by the name of augmentin has been sent to your pharmacy.  Take one tablet every 12 hours for the next 7 days until completed.  Always take with food and plenty of water.  You have been prescribed an anti-inflammatory by the name of naproxen.  You may take 1 tablet every 12 hours as needed for pain relief.  Always take with plenty of food and water.  Do not take ibuprofen or Motrin on top of this, stay on same medication family.  Though you may take Tylenol in addition to the naproxen.  Return to the ED for any new or worsening symptoms as discussed.

## 2022-08-25 IMAGING — CR DG CHEST 2V
2 series · 2 of 2 positions shown · non-contrast
Comparison: None.

CLINICAL DATA: Chest pain for several days, worse today

EXAM:
CHEST - 2 VIEW

[chest lat]
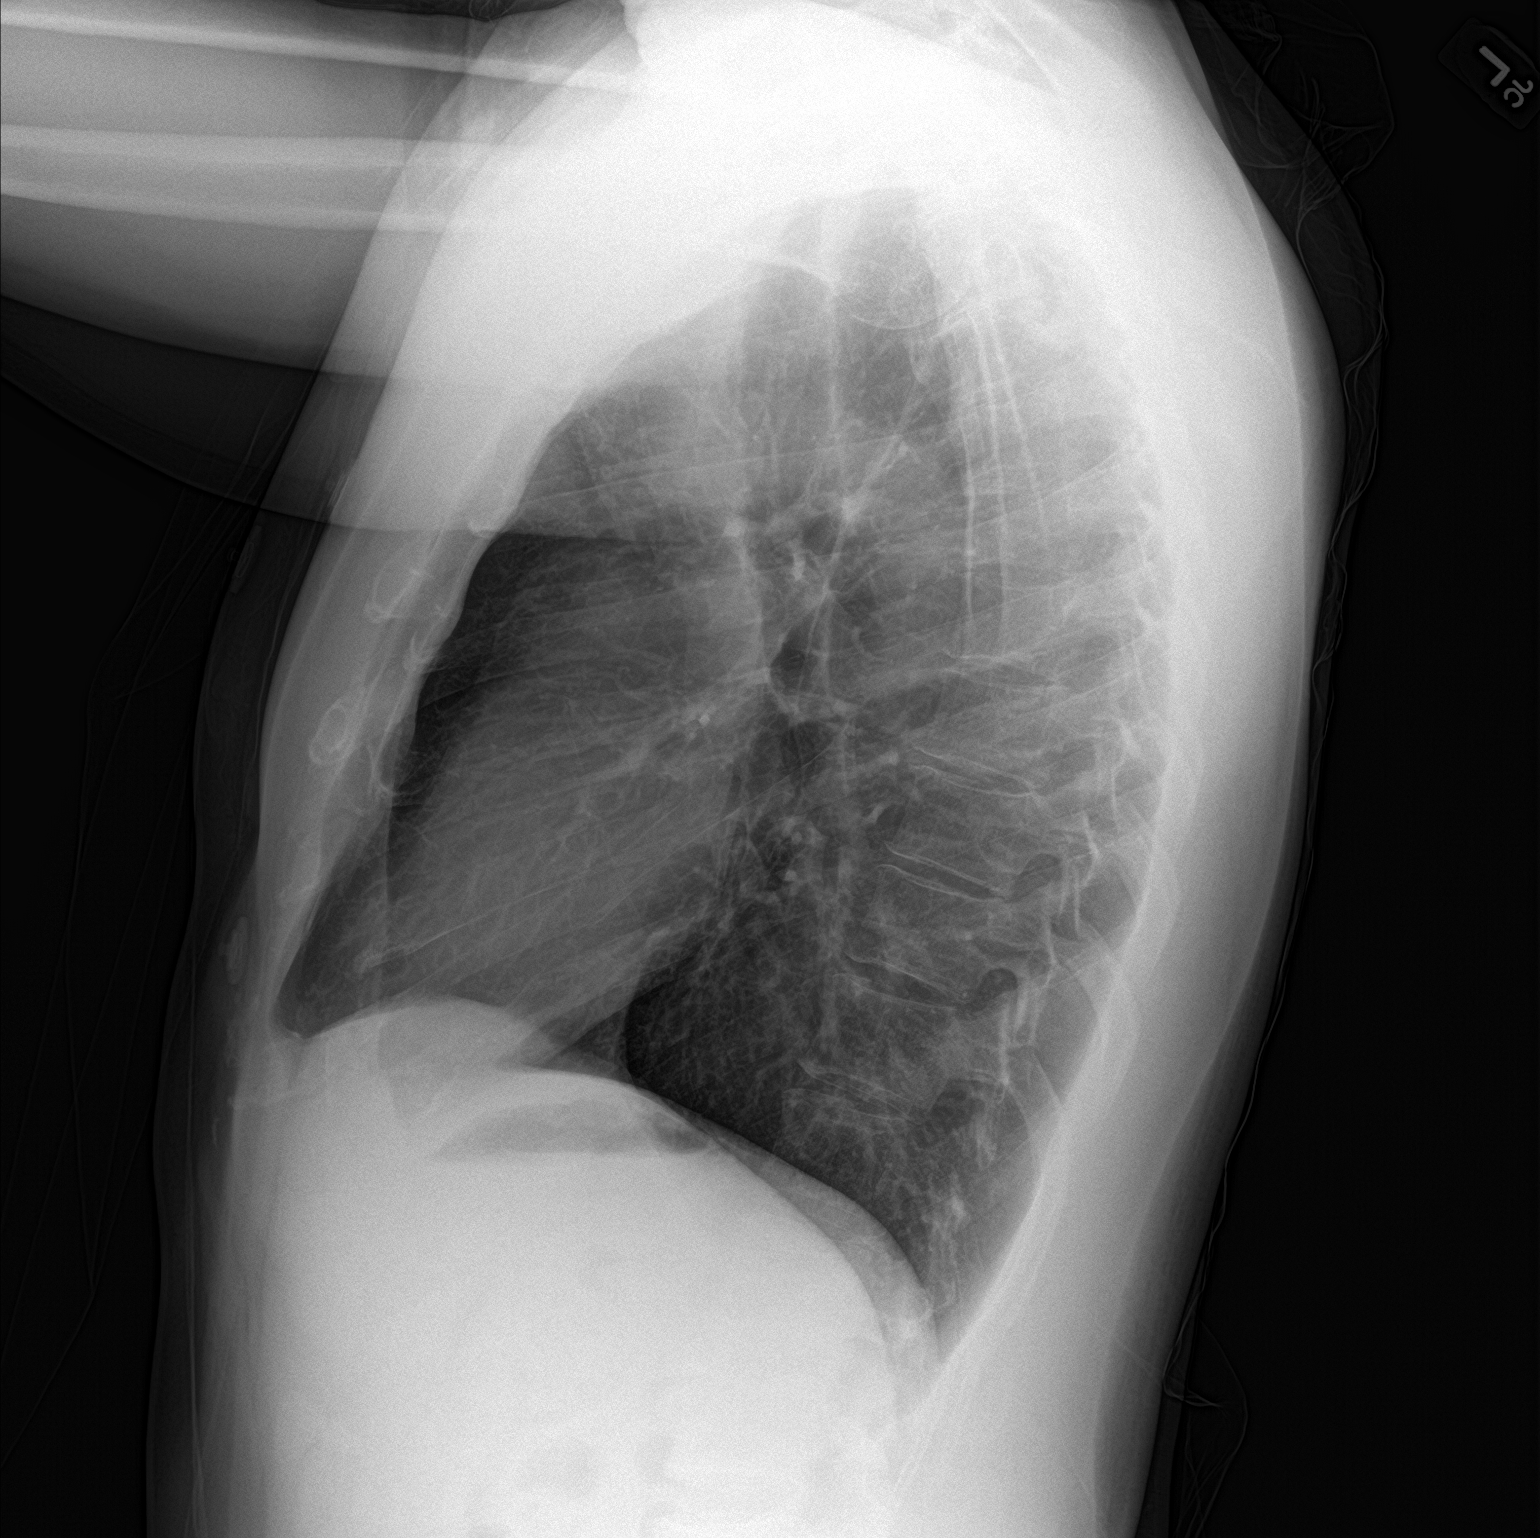

[chest pa]
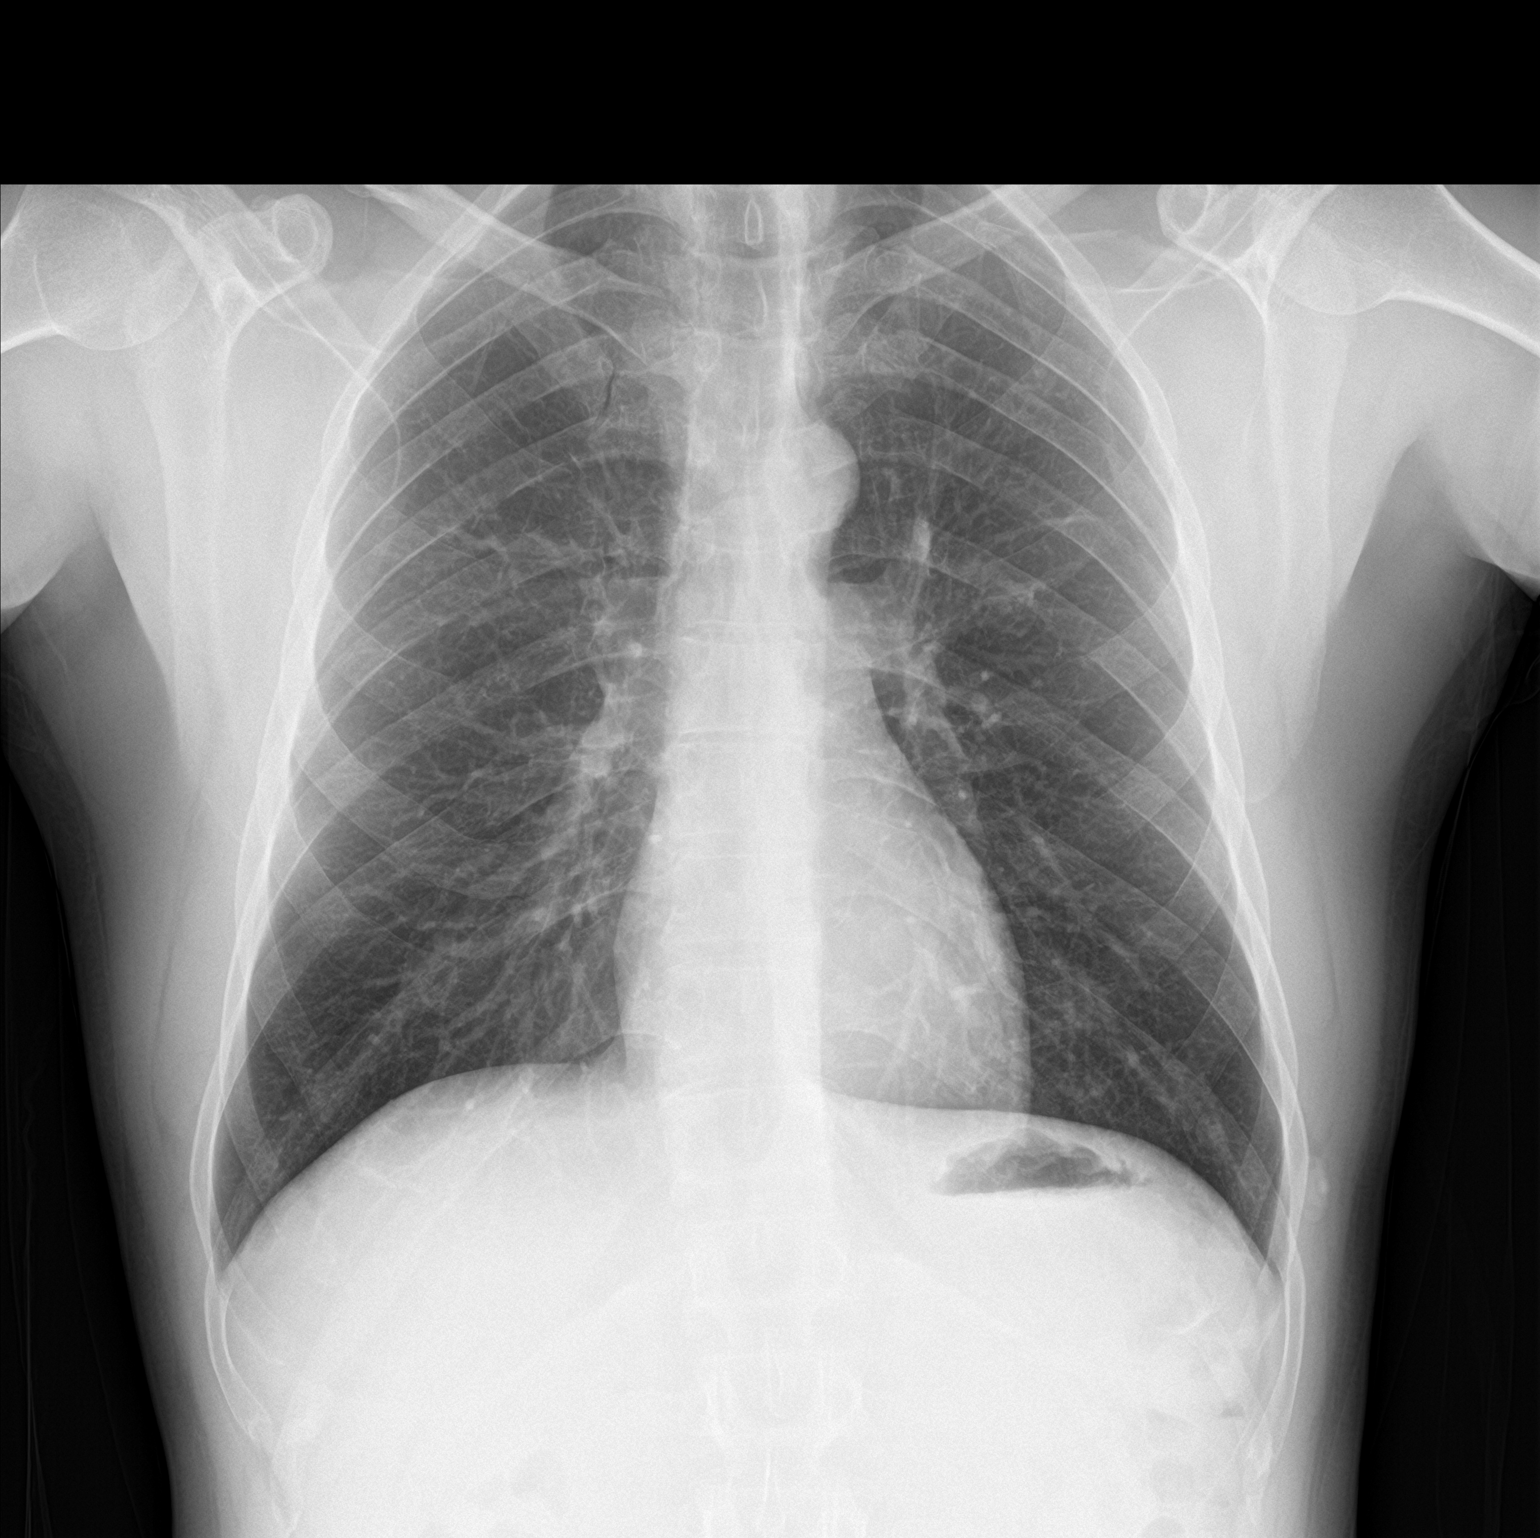

[2 of 2 positions shown; findings below may reference images not displayed]

FINDINGS: The heart size and mediastinal contours are within normal limits.
Both lungs are clear. No pleural effusion or pneumothorax. The
visualized skeletal structures are unremarkable.
IMPRESSION: No active cardiopulmonary disease.

## 2022-11-19 ENCOUNTER — Ambulatory Visit
Admission: EM | Admit: 2022-11-19 | Discharge: 2022-11-19 | Disposition: A | Discharge status: Home / Self Care | Payer: Medicaid Other | Attending: Internal Medicine | Admitting: Internal Medicine

## 2022-11-19 DIAGNOSIS — Z23 Encounter for immunization: Secondary | ICD-10-CM

## 2022-11-19 DIAGNOSIS — S61012A Laceration without foreign body of left thumb without damage to nail, initial encounter: Secondary | ICD-10-CM

## 2022-11-19 MED ORDER — TETANUS-DIPHTH-ACELL PERTUSSIS 5-2.5-18.5 LF-MCG/0.5 IM SUSY
0.5000 mL | PREFILLED_SYRINGE | Freq: Once | INTRAMUSCULAR | Status: AC
  Administered 2022-11-19: 0.5 mL via INTRAMUSCULAR

## 2022-11-19 NOTE — Discharge Instructions (Signed)

## 2022-11-19 NOTE — ED Provider Notes (Signed)
[facility]    CSN: [id number] Arrival date & time: 11/19/22  1630      History   Chief Complaint Chief Complaint  Patient presents with   Laceration    HPI Ernest Little is a 47 y.o. male.   Patient presents to urgent care for evaluation of left thumb laceration that happened several hours ago while he was using a chainsaw to cut down a tree. He was wearing a protective glove to the left hand but the chainsaw cut him through the glove. Bleeding controlled. No difficulty moving the left thumb or numbness/tingling distally.  He does not use blood thinners.  Denies previous injury to the left hand.  Unsure of date of last tetanus injection.     Past Medical History:  Diagnosis Date   Chronic hepatitis B (HCC)     There are no problems to display for this patient.   History reviewed. No pertinent surgical history.     Home Medications    Prior to Admission medications   Medication Sig Start Date End Date Taking? Authorizing Provider  albuterol (PROVENTIL HFA;VENTOLIN HFA) 108 (90 BASE) MCG/ACT inhaler Inhale 1-2 puffs into the lungs every 6 (six) hours as needed for wheezing or shortness of breath. Patient not taking: Reported on 11/24/2020 03/01/14   Ria Clock, PA  amoxicillin-clavulanate (AUGMENTIN) 875-125 MG tablet Take 1 tablet by mouth every 12 (twelve) hours. 01/05/22   Cecil Cobbs, PA-C  benzonatate (TESSALON) 100 MG capsule Take 1 capsule (100 mg total) by mouth 3 (three) times daily as needed for cough. Patient not taking: Reported on 11/24/2020 03/01/14   Ria Clock, PA  ipratropium (ATROVENT) 0.06 % nasal spray Place 2 sprays into both nostrils 4 (four) times daily. For nasal congestion 03/01/14   Presson, Jess Barters H, PA  naproxen (NAPROSYN) 500 MG tablet Take 1 tablet (500 mg total) by mouth 2 (two) times daily. 01/05/22   Cecil Cobbs, PA-C    Family History History reviewed. No pertinent family  history.  Social History Social History   Tobacco Use   Smoking status: Former    Packs/day: 1    Types: Cigarettes   Smokeless tobacco: Never  Vaping Use   Vaping Use: Every day  Substance Use Topics   Alcohol use: No   Drug use: Never     Allergies   Patient has no known allergies.   Review of Systems Review of Systems Per HPI  Physical Exam Triage Vital Signs ED Triage Vitals  Enc Vitals Group     BP 11/19/22 1744 121/80     Pulse Rate 11/19/22 1744 80     Resp 11/19/22 1744 18     Temp 11/19/22 1744 99.1 F (37.3 C)     Temp Source 11/19/22 1744 Oral     SpO2 11/19/22 1744 96 %     Weight --      Height --      Head Circumference --      Peak Flow --      Pain Score 11/19/22 1748 4     Pain Loc --      Pain Edu? --      Excl. in GC? --    No data found.  Updated Vital Signs BP 121/80 (BP Location: Left Arm)   Pulse 80   Temp 99.1 F (37.3 C) (Oral)   Resp 18   SpO2 96%   Visual Acuity Right Eye  Distance:   Left Eye Distance:   Bilateral Distance:    Right Eye Near:   Left Eye Near:    Bilateral Near:     Physical Exam Vitals and nursing note reviewed.  Constitutional:      Appearance: He is not ill-appearing or toxic-appearing.  HENT:     Head: Normocephalic and atraumatic.     Right Ear: Hearing and external ear normal.     Left Ear: Hearing and external ear normal.     Nose: Nose normal.     Mouth/Throat:     Lips: Pink.  Eyes:     General: Lids are normal. Vision grossly intact. Gaze aligned appropriately.     Extraocular Movements: Extraocular movements intact.     Conjunctiva/sclera: Conjunctivae normal.  Pulmonary:     Effort: Pulmonary effort is normal.  Musculoskeletal:     Cervical back: Neck supple.  Skin:    General: Skin is warm and dry.     Capillary Refill: Capillary refill takes less than 2 seconds.     Findings: Laceration (1.5 cm linear laceration to the first MCP of the left hand.  See image below.) present.  No rash.     Comments: Less than 2 capillary refill to the left thumb.  Nail intact.  Full range of motion of the left thumb without difficulty.  +2 left radial pulse.  Sensation and strength intact distally.  Neurological:     General: No focal deficit present.     Mental Status: He is alert and oriented to person, place, and time. Mental status is at baseline.     Cranial Nerves: No dysarthria or facial asymmetry.  Psychiatric:        Mood and Affect: Mood normal.        Speech: Speech normal.        Behavior: Behavior normal.        Thought Content: Thought content normal.        Judgment: Judgment normal.      UC Treatments / Results  Labs (all labs ordered are listed, but only abnormal results are displayed) Labs Reviewed - No data to display  EKG   Radiology No results found.  Procedures Laceration Repair  Date/Time: 11/19/2022 7:01 PM  Performed by: Carlisle Beers, FNP Authorized by: Carlisle Beers, FNP   Consent:    Consent obtained:  Verbal   Consent given by:  Patient   Risks, benefits, and alternatives were discussed: yes     Risks discussed:  Infection, need for additional repair, nerve damage, pain, poor cosmetic result, poor wound healing, retained foreign body, tendon damage and vascular damage   Alternatives discussed:  No treatment Universal protocol:    Patient identity confirmed:  Verbally with patient Anesthesia:    Anesthesia method:  Local infiltration   Local anesthetic:  Lidocaine 1% w/o epi Laceration details:    Location:  Finger   Finger location:  L thumb   Length (cm):  1.5   Depth (mm):  5 Exploration:    Wound exploration: wound explored through full range of motion     Wound extent: no foreign bodies/material noted, no tendon damage noted and no underlying fracture noted   Treatment:    Area cleansed with:  Povidone-iodine and soap and water   Amount of cleaning:  Standard Skin repair:    Repair method:  Sutures    Suture size:  4-0   Suture material:  Prolene   Suture technique:  Simple interrupted   Number of sutures:  3 Approximation:    Approximation:  Close Repair type:    Repair type:  Simple Post-procedure details:    Dressing:  Non-adherent dressing   Procedure completion:  Tolerated well, no immediate complications  (including critical care time)  Medications Ordered in UC Medications  Tdap (BOOSTRIX) injection 0.5 mL (0.5 mLs Intramuscular Given 11/19/22 1841)    Initial Impression / Assessment and Plan / UC Course  I have reviewed the triage vital signs and the nursing notes.  Pertinent labs & imaging results that were available during my care of the patient were reviewed by me and considered in my medical decision making (see chart for details).   1.  Laceration of left thumb without foreign body without damage to nail Laceration repaired, see procedure note above for details. Discussed wound care and cleaning at home.  Imaging:  Infection return precautions discussed.  Suture removal in 10 days.  Tdap updated today.  Tylenol as needed for pain at home.  Advised to rest and avoid activities that may increase tension to wound/sutures or expose wound to infection. Excuse note given.   Discussed red flag signs and symptoms of worsening condition,when to call the PCP office, return to urgent care, and when to seek higher level of care in the emergency department. Counseled patient regarding appropriate use of medications and potential side effects for all medications recommended or prescribed today. Patient verbalizes understanding and agreement with plan. Discharged in stable condition.     Final Clinical Impressions(s) / UC Diagnoses   Final diagnoses:  Laceration of left thumb without foreign body without damage to nail, initial encounter     Discharge Instructions      Wound care: Please keep the area surrounding the wound/sutures clean and dry for the next 24 hours.  After 24 hours, you may get the wound wet. Gently clean wound with antibacterial soap. Do not scrub wound. Cover the area with a nonstick bandage and change the bandage 2 times a day.   You should have the sutures removed in 10 days by your primary care provider or at urgent care. Return sooner than 10 days if you experience discharge from your laceration, redness around your laceration, warmth around your laceration, or fever.   You may take over the counter medicines as needed for aches and pains once the numbing wears off.   Thanks for letting me fix your cut today!    ED Prescriptions   None    PDMP not reviewed this encounter.   Carlisle Beers, [place] 11/19/22 1902

## 2022-11-19 NOTE — ED Triage Notes (Signed)
Pt presents reports he has a laceration in the left thumb, happened around 1530 today with a chainsaw. "Feel funny" the left thumb.

## 2022-12-03 ENCOUNTER — Ambulatory Visit
Admission: EM | Admit: 2022-12-03 | Discharge: 2022-12-03 | Disposition: A | Discharge status: Home / Self Care | Payer: Medicaid Other

## 2022-12-03 DIAGNOSIS — Z4802 Encounter for removal of sutures: Secondary | ICD-10-CM | POA: Diagnosis not present

## 2022-12-03 NOTE — ED Triage Notes (Signed)
Pt reports to UC  for 3 sutures to be removed. Denies any discharge, odor, or fever from stitches.

## 2023-11-29 ENCOUNTER — Emergency Department (HOSPITAL_COMMUNITY)

## 2023-11-29 ENCOUNTER — Encounter (HOSPITAL_COMMUNITY): Payer: Self-pay | Admitting: *Deleted

## 2023-11-29 ENCOUNTER — Emergency Department (HOSPITAL_COMMUNITY)
Admission: EM | Admit: 2023-11-29 | Discharge: 2023-11-30 | Disposition: A | Discharge status: Home / Self Care | Attending: Emergency Medicine | Admitting: Emergency Medicine

## 2023-11-29 ENCOUNTER — Other Ambulatory Visit: Payer: Self-pay

## 2023-11-29 DIAGNOSIS — M25562 Pain in left knee: Secondary | ICD-10-CM | POA: Diagnosis present
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
## 2023-11-29 NOTE — ED Triage Notes (Signed)
 No answer

## 2023-11-30 MED ORDER — NAPROXEN 250 MG PO TABS
500.0000 mg | ORAL_TABLET | Freq: Once | ORAL | Status: AC
  Administered 2023-11-30: 500 mg via ORAL
  Filled 2023-11-30: qty 2

## 2023-11-30 MED ORDER — NAPROXEN 500 MG PO TABS: 500.0000 mg | ORAL_TABLET | Freq: Two times a day (BID) | ORAL | 0 refills | Status: AC
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
## 2023-11-30 NOTE — Discharge Instructions (Addendum)
 Take the prescribed medication as directed.  Ice and elevate to help as well. Follow-up with orthopedics if ongoing issues-- can call for appt. Return to the ED for new or worsening symptoms.

## 2024-04-04 ENCOUNTER — Emergency Department (HOSPITAL_COMMUNITY)
Admission: EM | Admit: 2024-04-04 | Discharge: 2024-04-04 | Discharge status: Against Medical Advice | Attending: Emergency Medicine | Admitting: Emergency Medicine

## 2024-04-04 DIAGNOSIS — Z5321 Procedure and treatment not carried out due to patient leaving prior to being seen by health care provider: Secondary | ICD-10-CM | POA: Insufficient documentation

## 2024-04-04 DIAGNOSIS — S0195XA Open bite of unspecified part of head, initial encounter: Secondary | ICD-10-CM | POA: Insufficient documentation

## 2024-04-04 DIAGNOSIS — X58XXXA Exposure to other specified factors, initial encounter: Secondary | ICD-10-CM | POA: Diagnosis not present

## 2024-04-04 NOTE — ED Notes (Signed)
 Patient seen exiting the emergency room lobby

## 2024-04-04 NOTE — ED Notes (Addendum)
 Pt called for room w/o answer.  Pt was seen by ED staff leaving the department.
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
